# Patient Record
Sex: Male | Born: 1995 | Hispanic: Yes | Marital: Single | Smoking: Never smoker
Health system: Southern US, Community
[De-identification: ages and names within clinical notes are randomized; demographics above are authoritative.]

---
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
## 2017-06-29 IMAGING — CT CT RENAL STONE PROTOCOL
2 of 4 series · 16 of 46 positions shown, 18 images · non-contrast
Comparison: None.

CLINICAL DATA: 20-year-old male with acute right flank and
abdominal pain today.

EXAM:
CT ABDOMEN AND PELVIS WITHOUT CONTRAST
TECHNIQUE: Multidetector CT imaging of the abdomen and pelvis was performed
following the standard protocol without IV contrast.

[Series 2: axial st · axial · 0.70mm/px · z∈[-838,-434]mm · 13 of 89 slices shown, 15 images]
[im 4/89  soft-tissue]
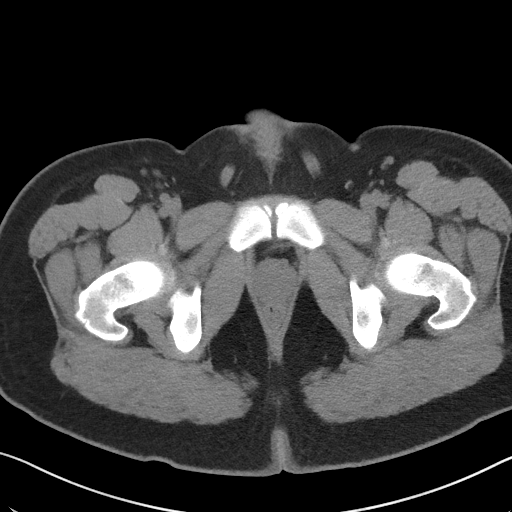
[im 4/89  bone]
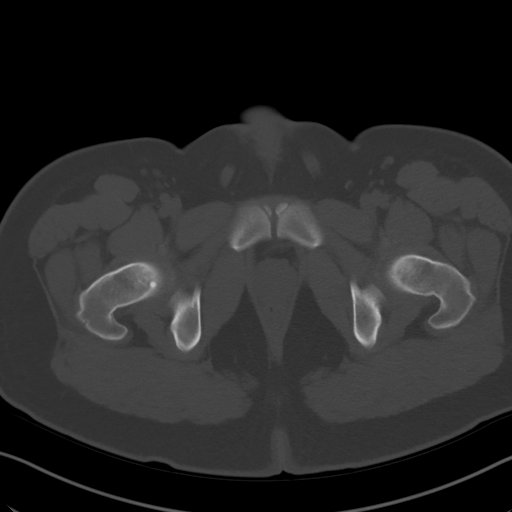
[im 11/89  soft-tissue]
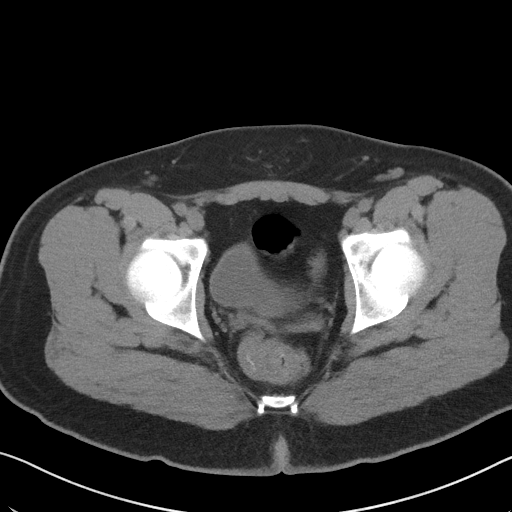
[im 18/89  soft-tissue]
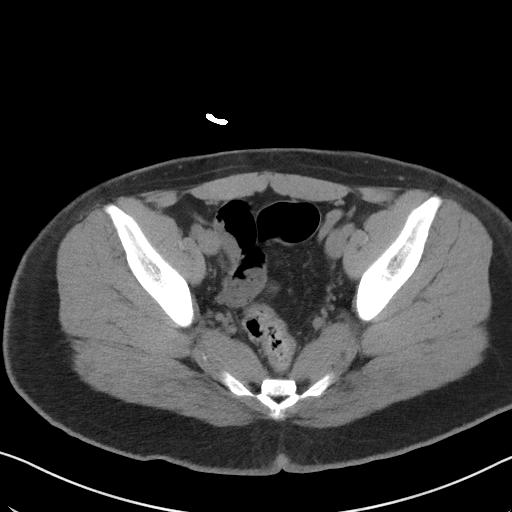
[im 25/89  soft-tissue]
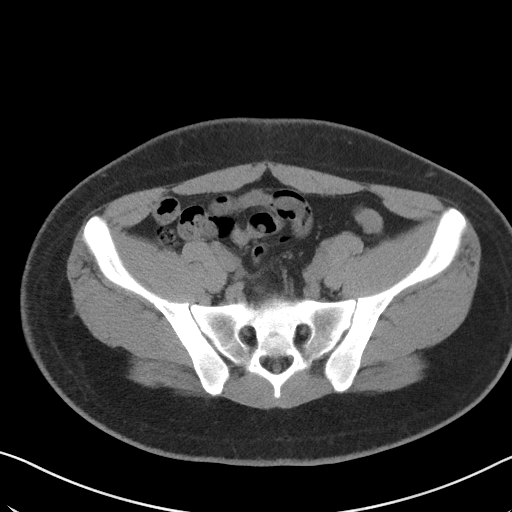
[im 32/89  soft-tissue]
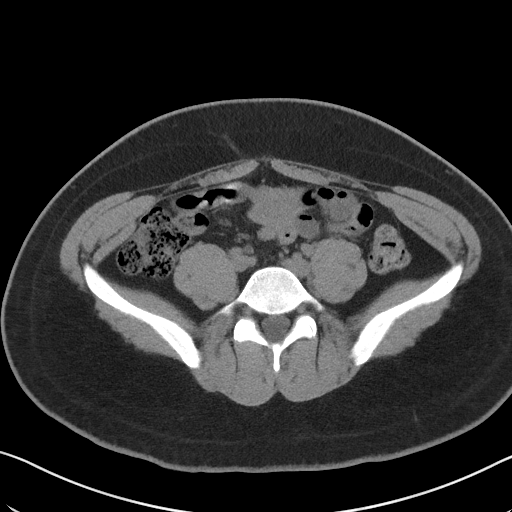
[im 39/89  soft-tissue]
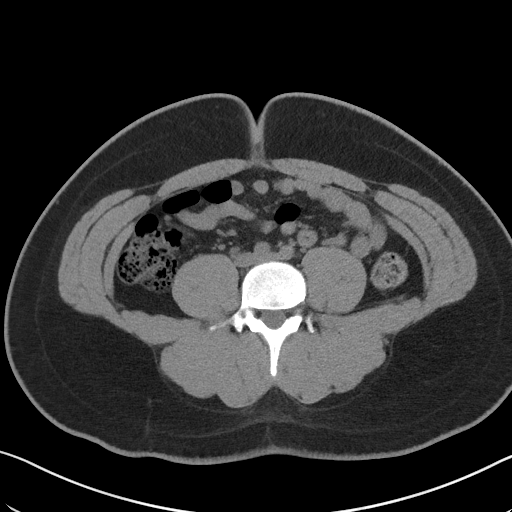
[im 46/89  soft-tissue]
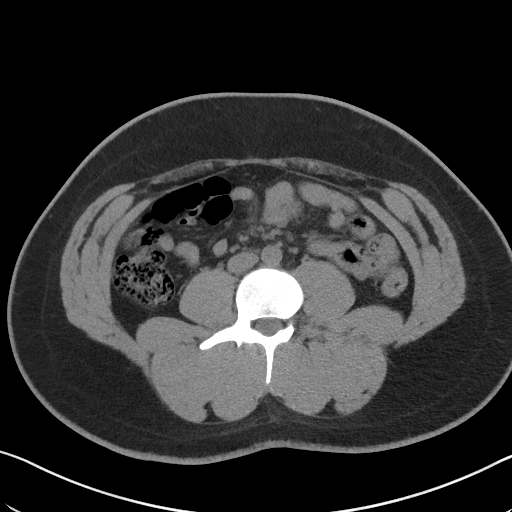
[im 50/89  soft-tissue]
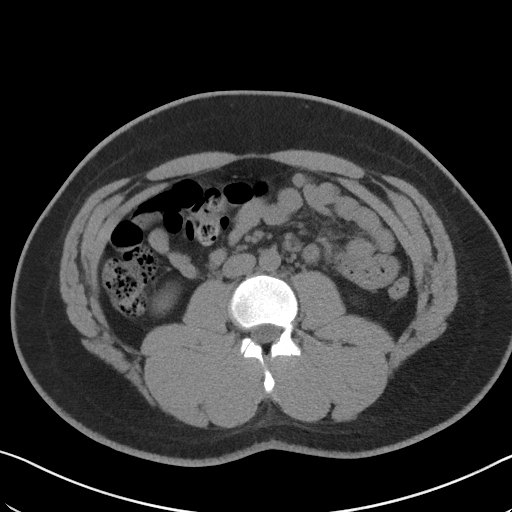
[im 57/89  soft-tissue]
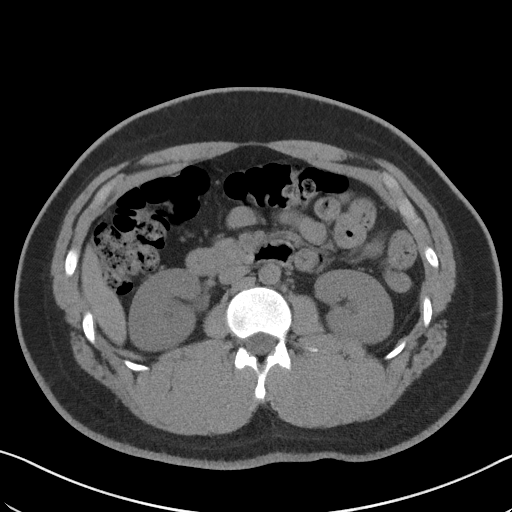
[im 57/89  bone]
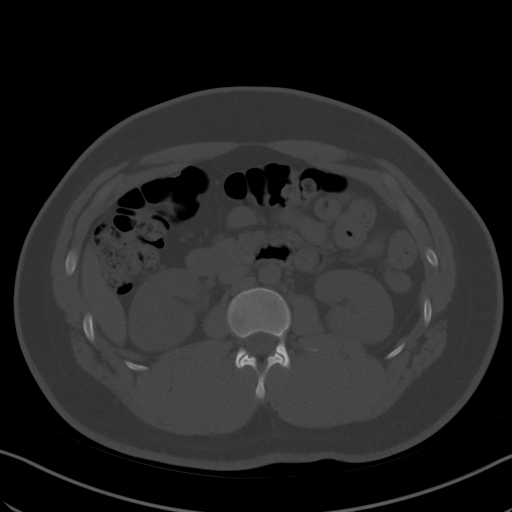
[im 64/89  soft-tissue]
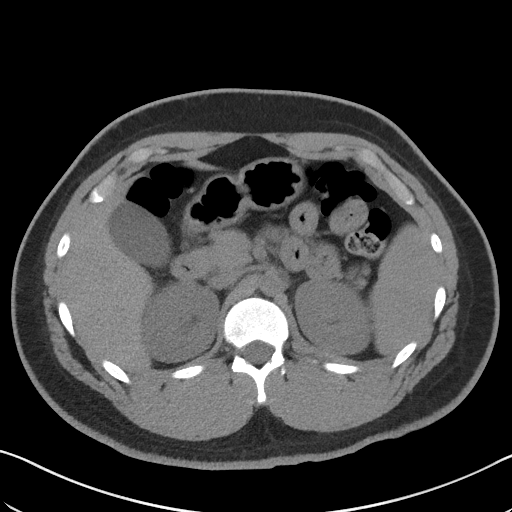
[im 71/89  soft-tissue]
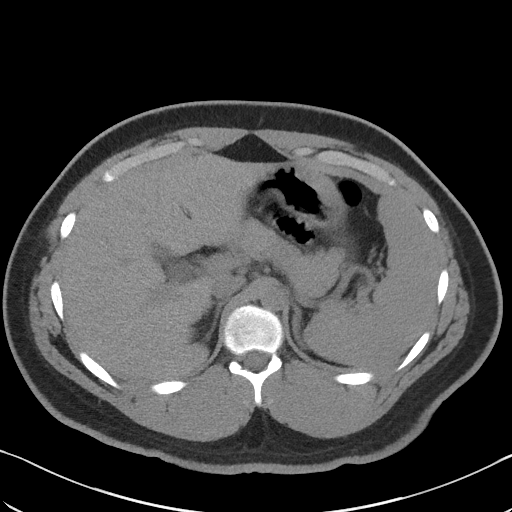
[im 78/89  soft-tissue]
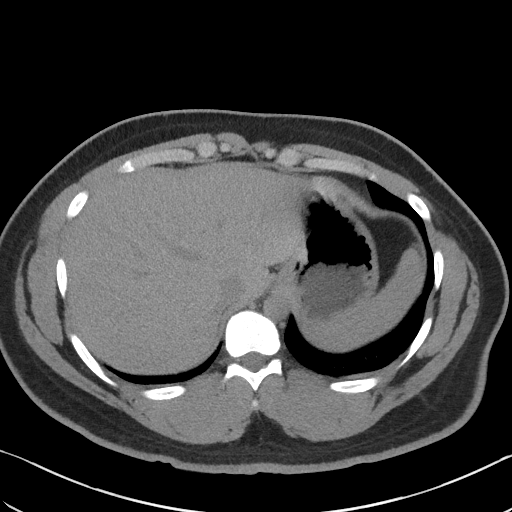
[im 85/89  soft-tissue]
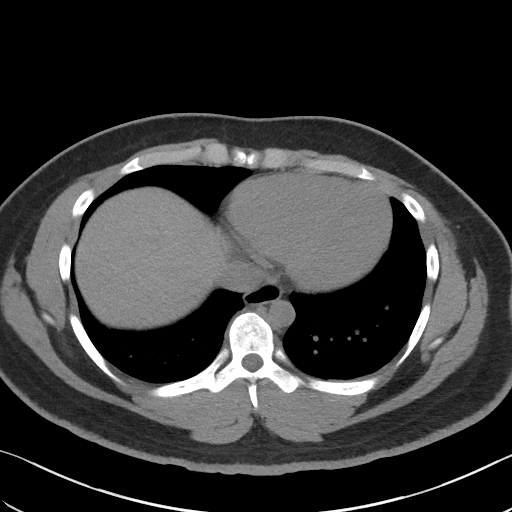

[Series 4: coronal st · coronal · 0.69mm/px · 3 of 78 slices shown]
[im 26/78  soft-tissue]
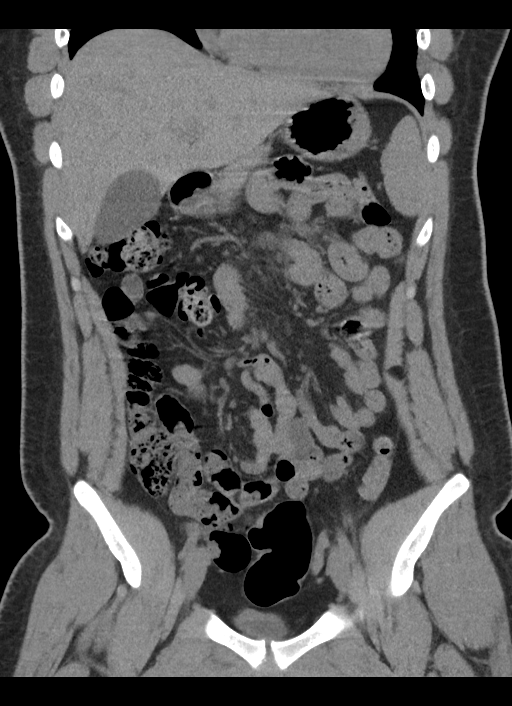
[im 35/78  soft-tissue]
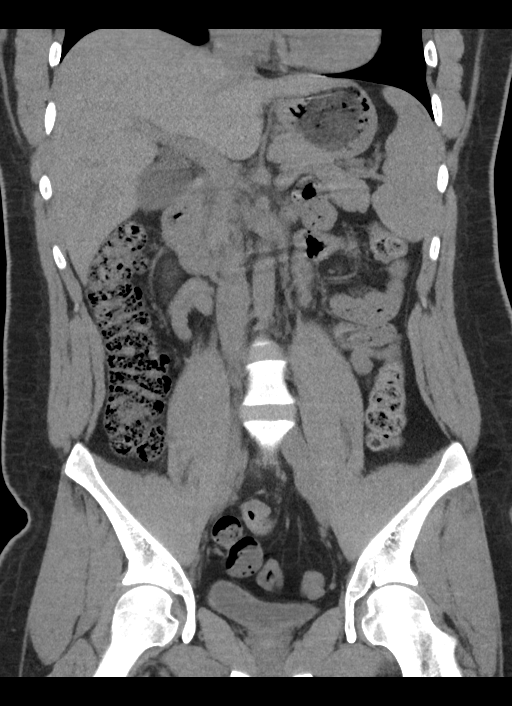
[im 43/78  soft-tissue]
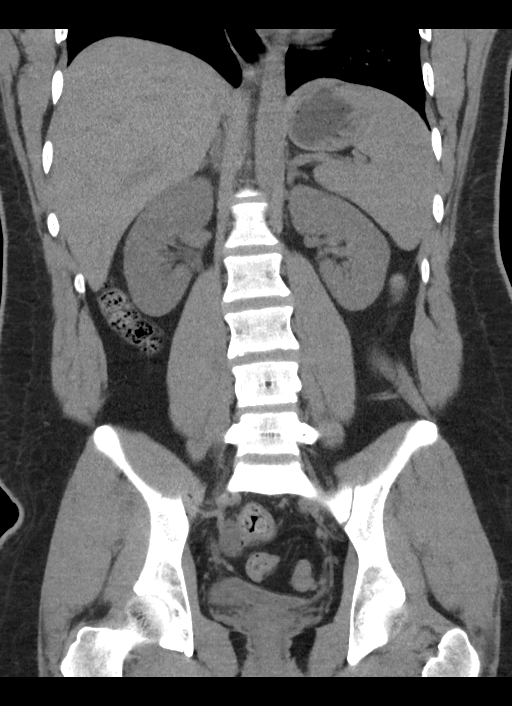

[16 of 46 positions shown; findings below may reference images not displayed]

FINDINGS: Please note that parenchymal abnormalities may be missed without
intravenous contrast.

Lower chest:  Unremarkable

Hepatobiliary: The liver and gallbladder are unremarkable. There is
no evidence of biliary dilatation.

Pancreas: Unremarkable

Spleen: Unremarkable

Adrenals/Urinary Tract: A 2 mm right UVJ calculus causes mild right
hydroureteronephrosis. The kidneys are otherwise unremarkable. No
other urinary calculi are identified. The adrenal glands are
unremarkable.

Stomach/Bowel: Unremarkable.

Vascular/Lymphatic: Unremarkable. No enlarged lymph nodes or
abdominal aortic aneurysm.

Reproductive: Unremarkable

Other: No free fluid, focal collection or pneumoperitoneum.

Musculoskeletal: Unremarkable.
IMPRESSION: 2 mm right UVJ calculus causing mild right hydroureteronephrosis.
